# Patient Record
Sex: Female | Born: 1950 | Race: White | Hispanic: No | Smoking: Current every day smoker
Health system: Southern US, Community
[De-identification: ages and names within clinical notes are randomized; demographics above are authoritative.]

## PROBLEM LIST (undated history)

## (undated) DIAGNOSIS — F419 Anxiety disorder, unspecified: Secondary | ICD-10-CM

## (undated) DIAGNOSIS — E039 Hypothyroidism, unspecified: Secondary | ICD-10-CM

## (undated) DIAGNOSIS — E785 Hyperlipidemia, unspecified: Secondary | ICD-10-CM

## (undated) DIAGNOSIS — I10 Essential (primary) hypertension: Secondary | ICD-10-CM

## (undated) HISTORY — DX: Hyperlipidemia, unspecified: E78.5

## (undated) HISTORY — DX: Anxiety disorder, unspecified: F41.9

## (undated) HISTORY — DX: Hypothyroidism, unspecified: E03.9

## (undated) HISTORY — DX: Essential (primary) hypertension: I10

---

## 1991-02-11 HISTORY — PX: TOTAL VAGINAL HYSTERECTOMY: SHX2548

## 2011-05-30 ENCOUNTER — Encounter: Payer: Self-pay | Admitting: Cardiovascular Disease

## 2014-02-08 ENCOUNTER — Ambulatory Visit (INDEPENDENT_AMBULATORY_CARE_PROVIDER_SITE_OTHER): Payer: 59 | Admitting: Cardiovascular Disease

## 2014-02-08 ENCOUNTER — Encounter: Payer: Self-pay | Admitting: Cardiovascular Disease

## 2014-02-08 ENCOUNTER — Encounter: Payer: Self-pay | Admitting: *Deleted

## 2014-02-08 VITALS — BP 105/72 | HR 84 | Ht 66.5 in | Wt 162.0 lb

## 2014-02-08 DIAGNOSIS — I10 Essential (primary) hypertension: Secondary | ICD-10-CM | POA: Insufficient documentation

## 2014-02-08 DIAGNOSIS — R9431 Abnormal electrocardiogram [ECG] [EKG]: Secondary | ICD-10-CM

## 2014-02-08 DIAGNOSIS — I2583 Coronary atherosclerosis due to lipid rich plaque: Secondary | ICD-10-CM

## 2014-02-08 DIAGNOSIS — R079 Chest pain, unspecified: Secondary | ICD-10-CM

## 2014-02-08 DIAGNOSIS — Z136 Encounter for screening for cardiovascular disorders: Secondary | ICD-10-CM

## 2014-02-08 DIAGNOSIS — I2510 Atherosclerotic heart disease of native coronary artery without angina pectoris: Secondary | ICD-10-CM | POA: Insufficient documentation

## 2014-02-08 DIAGNOSIS — E785 Hyperlipidemia, unspecified: Secondary | ICD-10-CM

## 2014-02-08 MED ORDER — ASPIRIN EC 81 MG PO TBEC: 81.0000 mg | DELAYED_RELEASE_TABLET | Freq: Every day | ORAL | Status: AC

## 2014-02-08 NOTE — Progress Notes (Signed)
Patient ID: Christie Jones, female   DOB: [birth year], 63 y.o.   MRN: [id number]       CARDIOLOGY CONSULT NOTE  Patient ID: Christie Jones MRN: [id number] DOB/AGE: [birth year] 63 y.o.  Admit date: (Not on file) Primary Physician Ardyth ManPARK,CHAN M, MD  Reason for Consultation: CAD  HPI: The patient is a 63 yr old woman with a reported history of CAD which has not required PCI. She also has a history of HTN and hypothyroidism. ECG performed in the office today demonstrates normal sinus rhythm with a diffuse nonspecific ST segment abnormality.  She tells me she underwent [facility] with [facility] somewhere between 1993 and 1995. She has had intermittent chest pains for the past 7 months. She can sometimes experiencing them once or twice a week and sometimes she may go one month without any chest pain. She describes it as a heavy dull sensation located in the retrosternal region and radiating across the left chest to her left axilla. It is accompanied by weakness, shortness of breath, and diaphoresis. On a few occasions her symptoms have been alleviated with sublingual nitroglycerin. She expresses them more with rest. She brought a copy of her lipids and additional lab work performed on 01/17/2014 which demonstrated total cholesterol 258, triglycerides 122, HDL 54, LDL 180. TSH 3.3, BUN 17, creatinine 0.91. AST 14, ALT 4. After the results of these lipids, she was switched from Crestor to Lipitor 40 mg daily. She has sciatica with tingling/numbness down the right leg. She also has intermittent left flank pain and RUQ abdominal pain.  Soc: Widowed for past 11 years. One son and 3 granddaughters.    No Known Allergies  Current Outpatient Prescriptions  Medication Sig Dispense Refill  . aspirin 325 MG tablet Take 325 mg by mouth daily.    Marland Kitchen. atorvastatin (LIPITOR) 40 MG tablet Take 40 mg by mouth daily.    Marland Kitchen.  HYDROcodone-acetaminophen (NORCO) 7.5-325 MG per tablet Take 1 tablet by mouth every 6 (six) hours as needed for moderate pain.    Marland Kitchen. levothyroxine (SYNTHROID, LEVOTHROID) 50 MCG tablet Take 50 mcg by mouth daily before breakfast.    . LORazepam (ATIVAN) 0.5 MG tablet Take 0.5 mg by mouth every 8 (eight) hours.    . nitroGLYCERIN (NITROSTAT) 0.4 MG SL tablet Place 0.4 mg under the tongue every 5 (five) minutes as needed for chest pain.    . nortriptyline (PAMELOR) 50 MG capsule Take 50 mg by mouth at bedtime.    Marland Kitchen. olmesartan-hydrochlorothiazide (BENICAR HCT) 40-25 MG per tablet Take 1 tablet by mouth daily.     No current facility-administered medications for this visit.    No past medical history on file.  No past surgical history on file.  History   Social History  . Marital Status: Widowed    Spouse Name: N/A    Number of Children: N/A  . Years of Education: N/A   Occupational History  . Not on file.   Social History Main Topics  . Smoking status: Current Every Renaud Smoker -- 0.75 packs/Fiorini for 50 years    Types: Cigarettes    Start date: 01/24/1964  . Smokeless tobacco: Never Used  . Alcohol Use: 0.0 oz/week    0 Not specified per week     Comment: occasional glass of wine or beer   . Drug Use: No  . Sexual Activity: No   Other Topics Concern  . Not on  file   Social History Narrative  . No narrative on file     No family history of premature CAD in 1st degree relatives.  Prior to Admission medications   Medication Sig Start Date End Date Taking? Authorizing Provider  aspirin 325 MG tablet Take 325 mg by mouth daily.   Yes Historical Provider, MD  atorvastatin (LIPITOR) 40 MG tablet Take 40 mg by mouth daily.   Yes Historical Provider, MD  HYDROcodone-acetaminophen (NORCO) 7.5-325 MG per tablet Take 1 tablet by mouth every 6 (six) hours as needed for moderate pain.   Yes Historical Provider, MD  levothyroxine (SYNTHROID, LEVOTHROID) 50 MCG tablet Take 50 mcg by  mouth daily before breakfast.   Yes Historical Provider, MD  LORazepam (ATIVAN) 0.5 MG tablet Take 0.5 mg by mouth every 8 (eight) hours.   Yes Historical Provider, MD  nitroGLYCERIN (NITROSTAT) 0.4 MG SL tablet Place 0.4 mg under the tongue every 5 (five) minutes as needed for chest pain.   Yes Historical Provider, MD  nortriptyline (PAMELOR) 50 MG capsule Take 50 mg by mouth at bedtime.   Yes Historical Provider, MD  olmesartan-hydrochlorothiazide (BENICAR HCT) 40-25 MG per tablet Take 1 tablet by mouth daily.   Yes Historical Provider, MD     Review of systems complete and found to be negative unless listed above in HPI     Physical exam Height 5' 6.5" (1.689 m), weight 162 lb (73.483 kg).   BP 105/72  Pulse 84  SpO2 99%   General: NAD Neck: No JVD, no thyromegaly or thyroid nodule.  Lungs: Clear to auscultation bilaterally with normal respiratory effort. CV: Nondisplaced PMI. Regular rate and rhythm, normal S1/S2, no S3/S4, no murmur.  No peripheral edema.  No carotid bruit.  Normal pedal pulses.  Abdomen: Soft, left flank tenderness, no hepatosplenomegaly, no distention.  Skin: Intact without lesions or rashes.  Neurologic: Alert and oriented x 3.  Psych: Normal affect. Extremities: No clubbing or cyanosis.  HEENT: Normal.   ECG: Most recent ECG reviewed.  Labs:  No results found for: WBC, HGB, HCT, MCV, PLT No results for input(s): NA, K, CL, CO2, BUN, CREATININE, CALCIUM, PROT, BILITOT, ALKPHOS, ALT, AST, GLUCOSE in the last 168 hours.  Invalid input(s): LABALBU No results found for: CKTOTAL, CKMB, CKMBINDEX, TROPONINI No results found for: CHOL No results found for: HDL No results found for: LDLCALC No results found for: TRIG No results found for: CHOLHDL No results found for: LDLDIRECT       Studies: No results found.  ASSESSMENT AND PLAN:  1. CAD: Her symptoms have both typical and atypical features of ischemic heart disease. Given her markedly elevated  LDL and abnormal ECG, will obtain an echocardiogram to assess LV systolic function and regional wall motion, as well as a Lexiscan Cardiolite stress test for further clarification. Will reduce ASA to 81 mg. Continue Lipitor 40 mg. 2. Essential HTN: Well controlled on current therapy which includes Benicar 40-25 mg daily. No changes. Renal function is normal as noted above. 3. Hyperlipidemia: Markedly elevated LDL and TC, for which Crestor was switched to Lipitor. Would recommend repeating lipids and LFT's in 10 weeks.  Dispo: f/u 4-6 weeks.  Signed: Prentice DockerSuresh Koneswaran, M.D., F.A.C.C.  02/08/2014, 1:37 PM

## 2014-02-08 NOTE — Patient Instructions (Addendum)
Your physician has requested that you have a lexiscan myoview. For further information please visit [email or web address]. Please follow instruction sheet, as given. Your physician has requested that you have an echocardiogram. Echocardiography is a painless test that uses sound waves to create images of your heart. It provides your doctor with information about the size and shape of your heart and how well your heart's chambers and valves are working. This procedure takes approximately one hour. There are no restrictions for this procedure. Office will contact with results via phone or letter.   Lab for lipids & liver function - due in 10 weeks - will mail reminder. Decrease Aspirin to 81mg  daily  Continue all other current medications. Follow up in  1 month

## 2014-02-08 NOTE — Addendum Note (Signed)
Addended by: Lesle ChrisHILL, Temiloluwa [place] G on: 02/08/2014 04:18 PM   Modules accepted: Level of Service

## 2014-02-17 ENCOUNTER — Encounter (HOSPITAL_COMMUNITY): Payer: Self-pay

## 2014-02-17 ENCOUNTER — Telehealth: Payer: Self-pay | Admitting: *Deleted

## 2014-02-17 ENCOUNTER — Ambulatory Visit (HOSPITAL_COMMUNITY)
Admission: RE | Admit: 2014-02-17 | Discharge: 2014-02-17 | Disposition: A | Discharge status: Home / Self Care | Payer: Medicare Other | Source: Ambulatory Visit | Attending: Cardiovascular Disease | Admitting: Cardiovascular Disease

## 2014-02-17 ENCOUNTER — Encounter (HOSPITAL_COMMUNITY)
Admission: RE | Admit: 2014-02-17 | Discharge: 2014-02-17 | Disposition: A | Discharge status: Home / Self Care | Payer: Medicare Other | Source: Ambulatory Visit | Attending: Cardiovascular Disease | Admitting: Cardiovascular Disease

## 2014-02-17 DIAGNOSIS — I10 Essential (primary) hypertension: Secondary | ICD-10-CM | POA: Diagnosis not present

## 2014-02-17 DIAGNOSIS — E785 Hyperlipidemia, unspecified: Secondary | ICD-10-CM | POA: Diagnosis not present

## 2014-02-17 DIAGNOSIS — I2510 Atherosclerotic heart disease of native coronary artery without angina pectoris: Secondary | ICD-10-CM | POA: Diagnosis not present

## 2014-02-17 DIAGNOSIS — R072 Precordial pain: Secondary | ICD-10-CM

## 2014-02-17 DIAGNOSIS — Z720 Tobacco use: Secondary | ICD-10-CM | POA: Diagnosis not present

## 2014-02-17 DIAGNOSIS — I2583 Coronary atherosclerosis due to lipid rich plaque: Secondary | ICD-10-CM

## 2014-02-17 DIAGNOSIS — R079 Chest pain, unspecified: Secondary | ICD-10-CM | POA: Insufficient documentation

## 2014-02-17 MED ORDER — SODIUM CHLORIDE 0.9 % IJ SOLN
10.0000 mL | INTRAMUSCULAR | Status: DC | PRN
  Administered 2014-02-17: 10 mL via INTRAVENOUS
  Filled 2014-02-17: qty 10

## 2014-02-17 MED ORDER — TECHNETIUM TC 99M SESTAMIBI GENERIC - CARDIOLITE
10.0000 | Freq: Once | INTRAVENOUS | Status: AC | PRN
  Administered 2014-02-17: 10 via INTRAVENOUS

## 2014-02-17 MED ORDER — REGADENOSON 0.4 MG/5ML IV SOLN
INTRAVENOUS | Status: AC
  Administered 2014-02-17: 0.4 mg via INTRAVENOUS
  Filled 2014-02-17: qty 5

## 2014-02-17 MED ORDER — SODIUM CHLORIDE 0.9 % IJ SOLN
INTRAMUSCULAR | Status: AC
  Administered 2014-02-17: 10 mL via INTRAVENOUS
  Filled 2014-02-17: qty 3

## 2014-02-17 MED ORDER — TECHNETIUM TC 99M SESTAMIBI - CARDIOLITE
30.0000 | Freq: Once | INTRAVENOUS | Status: AC | PRN
  Administered 2014-02-17: 11:00:00 30 via INTRAVENOUS

## 2014-02-17 MED ORDER — REGADENOSON 0.4 MG/5ML IV SOLN
0.4000 mg | Freq: Once | INTRAVENOUS | Status: AC | PRN
  Administered 2014-02-17: 0.4 mg via INTRAVENOUS

## 2014-02-17 NOTE — Telephone Encounter (Signed)
-----   Message from Laqueta LindenSuresh A Koneswaran, MD sent at 02/17/2014  3:49 PM EST ----- Normal.

## 2014-02-17 NOTE — Progress Notes (Signed)
  Echocardiogram 2D Echocardiogram has been performed.  Christie Jones 02/17/2014, 3:03 PM

## 2014-02-17 NOTE — Telephone Encounter (Signed)
Pt made aware, forwarded to Dr. Willaim BanePark. Pt confirmed appt for 1/22

## 2014-02-17 NOTE — Progress Notes (Signed)
Stress Lab Nurses Notes - Jackelyn Polingnnie Penn  Ghazal J Zenner 02/17/2014 Reason for doing test: CAD and Chest Pain Type of test: Marlane HatcherLexiscan Cardiolite Nurse performing test: Parke PoissonPhyllis Billingsly, RN Nuclear Medicine Tech: Marcella DubsMiranda Womack Echo Tech: Not Applicable MD performing test: Branch/K.Lyman BishopLawrence NP Family MD: Dr. Willaim BanePark Test explained and consent signed: Yes.   IV started: Saline lock flushed, No redness or edema and Saline lock started in radiology Symptoms: Chest tightness Treatment/Intervention: None Reason test stopped: protocol completed After recovery IV was: Discontinued via X-ray tech and No redness or edema Patient to return to Nuc. Med at : 11:30 Patient discharged: Home Patient's Condition upon discharge was: stable Comments: During test BP 132/68 & HR 94.  Recovery BP 132/69 & HR 75.  Symptoms resolved in recovery. Erskine SpeedBillingsley, Rosemaria Inabinet T

## 2014-02-20 ENCOUNTER — Telehealth: Payer: Self-pay | Admitting: *Deleted

## 2014-02-20 NOTE — Telephone Encounter (Signed)
Pt made aware of both echo and stress test. Confirmed appt for 1/22 with Dr. Purvis SheffieldKoneswaran. Forwarded results to Dr. Willaim BanePark

## 2014-02-20 NOTE — Telephone Encounter (Signed)
-----   Message from Suresh A Koneswaran, MD sent at 02/17/2014  3:49 PM EST ----- Normal. 

## 2014-03-02 ENCOUNTER — Encounter: Payer: Self-pay | Admitting: Cardiovascular Disease

## 2014-03-02 ENCOUNTER — Ambulatory Visit (INDEPENDENT_AMBULATORY_CARE_PROVIDER_SITE_OTHER): Payer: Medicare Other | Admitting: Cardiovascular Disease

## 2014-03-02 VITALS — BP 138/82 | HR 87 | Ht 66.0 in | Wt 163.0 lb

## 2014-03-02 DIAGNOSIS — R002 Palpitations: Secondary | ICD-10-CM

## 2014-03-02 DIAGNOSIS — I10 Essential (primary) hypertension: Secondary | ICD-10-CM

## 2014-03-02 DIAGNOSIS — Z136 Encounter for screening for cardiovascular disorders: Secondary | ICD-10-CM

## 2014-03-02 DIAGNOSIS — I2583 Coronary atherosclerosis due to lipid rich plaque: Secondary | ICD-10-CM

## 2014-03-02 DIAGNOSIS — R079 Chest pain, unspecified: Secondary | ICD-10-CM

## 2014-03-02 DIAGNOSIS — I2510 Atherosclerotic heart disease of native coronary artery without angina pectoris: Secondary | ICD-10-CM

## 2014-03-02 DIAGNOSIS — IMO0001 Reserved for inherently not codable concepts without codable children: Secondary | ICD-10-CM

## 2014-03-02 DIAGNOSIS — E785 Hyperlipidemia, unspecified: Secondary | ICD-10-CM

## 2014-03-02 MED ORDER — METOPROLOL TARTRATE 25 MG PO TABS: 12.5000 mg | ORAL_TABLET | ORAL | Status: AC | PRN

## 2014-03-02 NOTE — Progress Notes (Signed)
Patient ID: Christie Jones, female   DOB: [birth year], 64 y.o.   MRN: [id number]      SUBJECTIVE: The patient presents for follow-up after undergoing cardiovascular testing performed for the evaluation of chest pain. Nuclear stress testing was entirely normal. Echocardiogram was also normal demonstrating normal systolic and diastolic function, EF 60-65%. She has continued to experience spontaneous episodes of chest pain with palpitations which may occur up to 3 times per week but may sometimes not occur for a week at all. It can be associated with fatigue and shortness of breath. She has no history of esophageal reflux disease. She does eat cayenne peppers on a near daily basis. She very seldom has constipation or diarrhea. She denies abdominal pain as well as nausea and vomiting.   Review of Systems: As per "subjective", otherwise negative.  No Known Allergies  Current Outpatient Prescriptions  Medication Sig Dispense Refill  . aspirin EC 81 MG tablet Take 1 tablet (81 mg total) by mouth daily.    Marland Kitchen. atorvastatin (LIPITOR) 40 MG tablet Take 40 mg by mouth daily.    Marland Kitchen. HYDROcodone-acetaminophen (NORCO) 7.5-325 MG per tablet Take 1 tablet by mouth every 6 (six) hours as needed for moderate pain.    Marland Kitchen. levothyroxine (SYNTHROID, LEVOTHROID) 50 MCG tablet Take 50 mcg by mouth daily before breakfast.    . LORazepam (ATIVAN) 0.5 MG tablet Take 0.5 mg by mouth every 8 (eight) hours.    . nitroGLYCERIN (NITROSTAT) 0.4 MG SL tablet Place 0.4 mg under the tongue every 5 (five) minutes as needed for chest pain.    . nortriptyline (PAMELOR) 50 MG capsule Take 50 mg by mouth at bedtime.    Marland Kitchen. olmesartan-hydrochlorothiazide (BENICAR HCT) 40-25 MG per tablet Take 0.5 tablets by mouth daily.      No current facility-administered medications for this visit.    No past medical history on file.  No past surgical history on file.  History   Social History  . Marital Status: Widowed    Spouse Name: N/A   Number of Children: N/A  . Years of Education: N/A   Occupational History  . Not on file.   Social History Main Topics  . Smoking status: Current Every Teaster Smoker -- 0.75 packs/[place] for 50 years    Types: Cigarettes    Start date: 01/24/1964  . Smokeless tobacco: Never Used  . Alcohol Use: 0.0 oz/week    0 Not specified per week     Comment: occasional glass of wine or beer   . Drug Use: No  . Sexual Activity: No   Other Topics Concern  . Not on file   Social History Narrative     Filed Vitals:   03/02/14 1137  BP: 138/82  Pulse: 87  Height: 5\' 6"  (1.676 m)  Weight: 163 lb (73.936 kg)  SpO2: 97%    PHYSICAL EXAM General: NAD HEENT: Normal. Neck: No JVD, no thyromegaly. Lungs: Clear to auscultation bilaterally with normal respiratory effort. CV: Nondisplaced PMI.  Regular rate and rhythm, normal S1/S2, no S3/S4, no murmur. No pretibial or periankle edema.  No carotid bruit.  Normal pedal pulses.  Abdomen: Soft, nontender, no hepatosplenomegaly, no distention.  Neurologic: Alert and oriented x 3.  Psych: Normal affect. Skin: Normal. Musculoskeletal: Normal range of motion, no gross deformities. Extremities: No clubbing or cyanosis.   ECG: Most recent ECG reviewed.      ASSESSMENT AND PLAN: 1. CAD: Normal cardiac testing as noted above. Continues to experience episodic  chest pains with palpitations. Will prescribe 12.5 mg metoprolol prn. Continue ASA 81 mg and Lipitor 40 mg. 2. Essential HTN: Reasonably controlled on current therapy which includes Benicar 40-25 mg daily. No changes.  3. Hyperlipidemia: Markedly elevated LDL and TC, for which Crestor was switched to Lipitor. Would recommend repeating lipids and LFT's in 7 weeks.  Dispo: f/u 3 months.   Prentice Docker, M.D., F.A.C.C.

## 2014-03-02 NOTE — Patient Instructions (Signed)
Your physician recommends that you schedule a follow-up appointment in: 3 months. Your physician has recommended you make the following change in your medication:  Start metoprolol tartrate 12.5 mg as needed for palpitations and chest tightness. No more than 50 mg total in a 24 hour period. Continue all other medications the same. Call office in 1 month to give an update on your symptoms.

## 2014-03-03 ENCOUNTER — Ambulatory Visit: Payer: 59 | Admitting: Cardiovascular Disease

## 2014-03-20 ENCOUNTER — Ambulatory Visit: Payer: 59 | Admitting: Cardiovascular Disease

## 2014-06-05 ENCOUNTER — Ambulatory Visit: Payer: Medicare Other | Admitting: Cardiovascular Disease

## 2016-01-23 IMAGING — NM NM MYOCAR MULTI W/SPECT W/WALL MOTION & EF
2 series · 12 of 12 positions shown · non-contrast
Comparison: None.

CLINICAL DATA: 63-year-old female with a known history of coronary
artery disease referred for chest pain.

EXAM:
MYOCARDIAL IMAGING WITH SPECT (REST AND PHARMACOLOGIC-STRESS)
GATED LEFT VENTRICULAR WALL MOTION STUDY
LEFT VENTRICULAR EJECTION FRACTION
TECHNIQUE: Standard myocardial SPECT imaging was performed after resting
intravenous injection of 10 mCi Sc-11m sestamibi. Subsequently,
intravenous infusion of Lexiscan was performed under the supervision
of the Cardiology staff. At peak effect of the drug, 30 mCi Sc-11m
sestamibi was injected intravenously and standard myocardial SPECT
imaging was performed. Quantitative gated imaging was also performed
to evaluate left ventricular wall motion, and estimate left
ventricular ejection fraction.

[Series 1: rest · 8.28mm/px · 6 of 64 frames shown]
[frame 6/64]
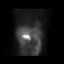
[frame 16/64]
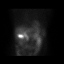
[frame 27/64]
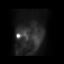
[frame 38/64]
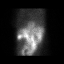
[frame 48/64]
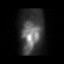
[frame 59/64]
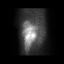

[Series 3: stress gated - perfusion · 8.28mm/px · 6 of 64 frames shown]
[frame 6/64]
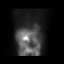
[frame 16/64]
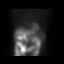
[frame 27/64]
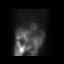
[frame 38/64]
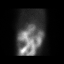
[frame 48/64]
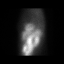
[frame 59/64]
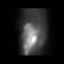

[12 of 12 positions shown; findings below may reference images not displayed]

FINDINGS: Pharmacological stress

Baseline EKG shows normal sinus rhythm. After injection heart rate
increased from 58 beats per min up to 97 beats per min, and blood
pressure increased from 131/75 up to 132/68. The test was stopped
after injection was complete, the patient did not experience any
chest pain. Post-injection there was borderline ST depressions in
the inferior limb and lateral precordial leads 0.5-1.0 mm. There
were no significant arrhythmias.

Perfusion: No decreased activity in the left ventricle on stress
imaging to suggest reversible ischemia or infarction.

Wall Motion: Normal left ventricular wall motion. No left
ventricular dilation.

Left Ventricular Ejection Fraction: 83 %

End diastolic volume 42 ml

End systolic volume 7 ml
IMPRESSION: 1. No reversible ischemia or infarction.

2. Normal left ventricular wall motion.

3. Left ventricular ejection fraction 83%

4. Low-risk stress test findings*.

*1321 Appropriate Use Criteria for Coronary Revascularization
Focused Update: J Am Coll Cardiol. 1321;59(9):857-881.
[URL]
